# Patient Record
Sex: Female | Born: 1995 | Race: Black or African American | Hispanic: No | Marital: Single | Smoking: Never smoker
Health system: Southern US, Community
[De-identification: ages and names within clinical notes are randomized; demographics above are authoritative.]

---

## 2018-01-01 ENCOUNTER — Ambulatory Visit (HOSPITAL_COMMUNITY): Admission: EM | Admit: 2018-01-01 | Discharge: 2018-01-01 | Discharge status: Against Medical Advice | Payer: Self-pay

## 2018-01-02 ENCOUNTER — Ambulatory Visit: Payer: Self-pay

## 2018-01-02 ENCOUNTER — Other Ambulatory Visit: Payer: Self-pay | Admitting: Occupational Medicine

## 2018-01-02 DIAGNOSIS — M79671 Pain in right foot: Secondary | ICD-10-CM

## 2020-10-14 ENCOUNTER — Other Ambulatory Visit: Payer: Self-pay

## 2020-10-14 ENCOUNTER — Encounter (HOSPITAL_BASED_OUTPATIENT_CLINIC_OR_DEPARTMENT_OTHER): Payer: Self-pay | Admitting: *Deleted

## 2020-10-14 DIAGNOSIS — R519 Headache, unspecified: Secondary | ICD-10-CM | POA: Diagnosis not present

## 2020-10-14 DIAGNOSIS — Y92410 Unspecified street and highway as the place of occurrence of the external cause: Secondary | ICD-10-CM | POA: Insufficient documentation

## 2020-10-14 DIAGNOSIS — M79651 Pain in right thigh: Secondary | ICD-10-CM | POA: Insufficient documentation

## 2020-10-14 DIAGNOSIS — M25551 Pain in right hip: Secondary | ICD-10-CM | POA: Insufficient documentation
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
## 2020-10-15 ENCOUNTER — Emergency Department (HOSPITAL_BASED_OUTPATIENT_CLINIC_OR_DEPARTMENT_OTHER): Payer: BLUE CROSS/BLUE SHIELD

## 2020-10-15 ENCOUNTER — Emergency Department (HOSPITAL_BASED_OUTPATIENT_CLINIC_OR_DEPARTMENT_OTHER)
Admission: EM | Admit: 2020-10-15 | Discharge: 2020-10-15 | Disposition: A | Discharge status: Home / Self Care | Payer: BLUE CROSS/BLUE SHIELD | Attending: Emergency Medicine | Admitting: Emergency Medicine

## 2020-10-15 DIAGNOSIS — M79604 Pain in right leg: Secondary | ICD-10-CM

## 2020-10-15 LAB — PREGNANCY, URINE: Preg Test, Ur: NEGATIVE

## 2020-10-15 MED ORDER — METHOCARBAMOL 500 MG PO TABS: 500.0000 mg | ORAL_TABLET | Freq: Three times a day (TID) | ORAL | 0 refills | Status: AC | PRN

## 2020-10-15 MED ORDER — IBUPROFEN 800 MG PO TABS: 800.0000 mg | ORAL_TABLET | Freq: Three times a day (TID) | ORAL | 0 refills | Status: AC | PRN

## 2020-10-15 MED ORDER — DICLOFENAC SODIUM 1 % EX GEL: 2.0000 g | Freq: Four times a day (QID) | CUTANEOUS | 0 refills | Status: AC | PRN
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
## 2020-10-15 NOTE — Discharge Instructions (Signed)
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
